# Patient Record
Sex: Female | Born: 1935 | Race: White | Hispanic: No
Health system: Southern US, Community
[De-identification: ages and names within clinical notes are randomized; demographics above are authoritative.]

---

## 1998-01-13 ENCOUNTER — Ambulatory Visit (HOSPITAL_COMMUNITY): Admission: RE | Admit: 1998-01-13 | Discharge: 1998-01-13 | Payer: Self-pay | Admitting: Obstetrics & Gynecology

## 2006-06-01 ENCOUNTER — Encounter: Admission: RE | Admit: 2006-06-01 | Discharge: 2006-06-01 | Payer: Self-pay | Admitting: Orthopedic Surgery

## 2006-06-02 ENCOUNTER — Ambulatory Visit (HOSPITAL_BASED_OUTPATIENT_CLINIC_OR_DEPARTMENT_OTHER): Admission: RE | Admit: 2006-06-02 | Discharge: 2006-06-02 | Payer: Self-pay | Admitting: Orthopedic Surgery

## 2008-11-13 IMAGING — CR DG CHEST 2V
2 series · 2 of 2 positions shown · non-contrast
Comparison: none

CLINICAL DATA: Preop respiratory exam for knee surgery for tear. 
 TWO VIEW CHEST:
 Two views of the chest show the lungs to be clear.  There is moderate cardiomegaly present.  No acute bony abnormality is seen.

[view not recorded (1 of 2)]
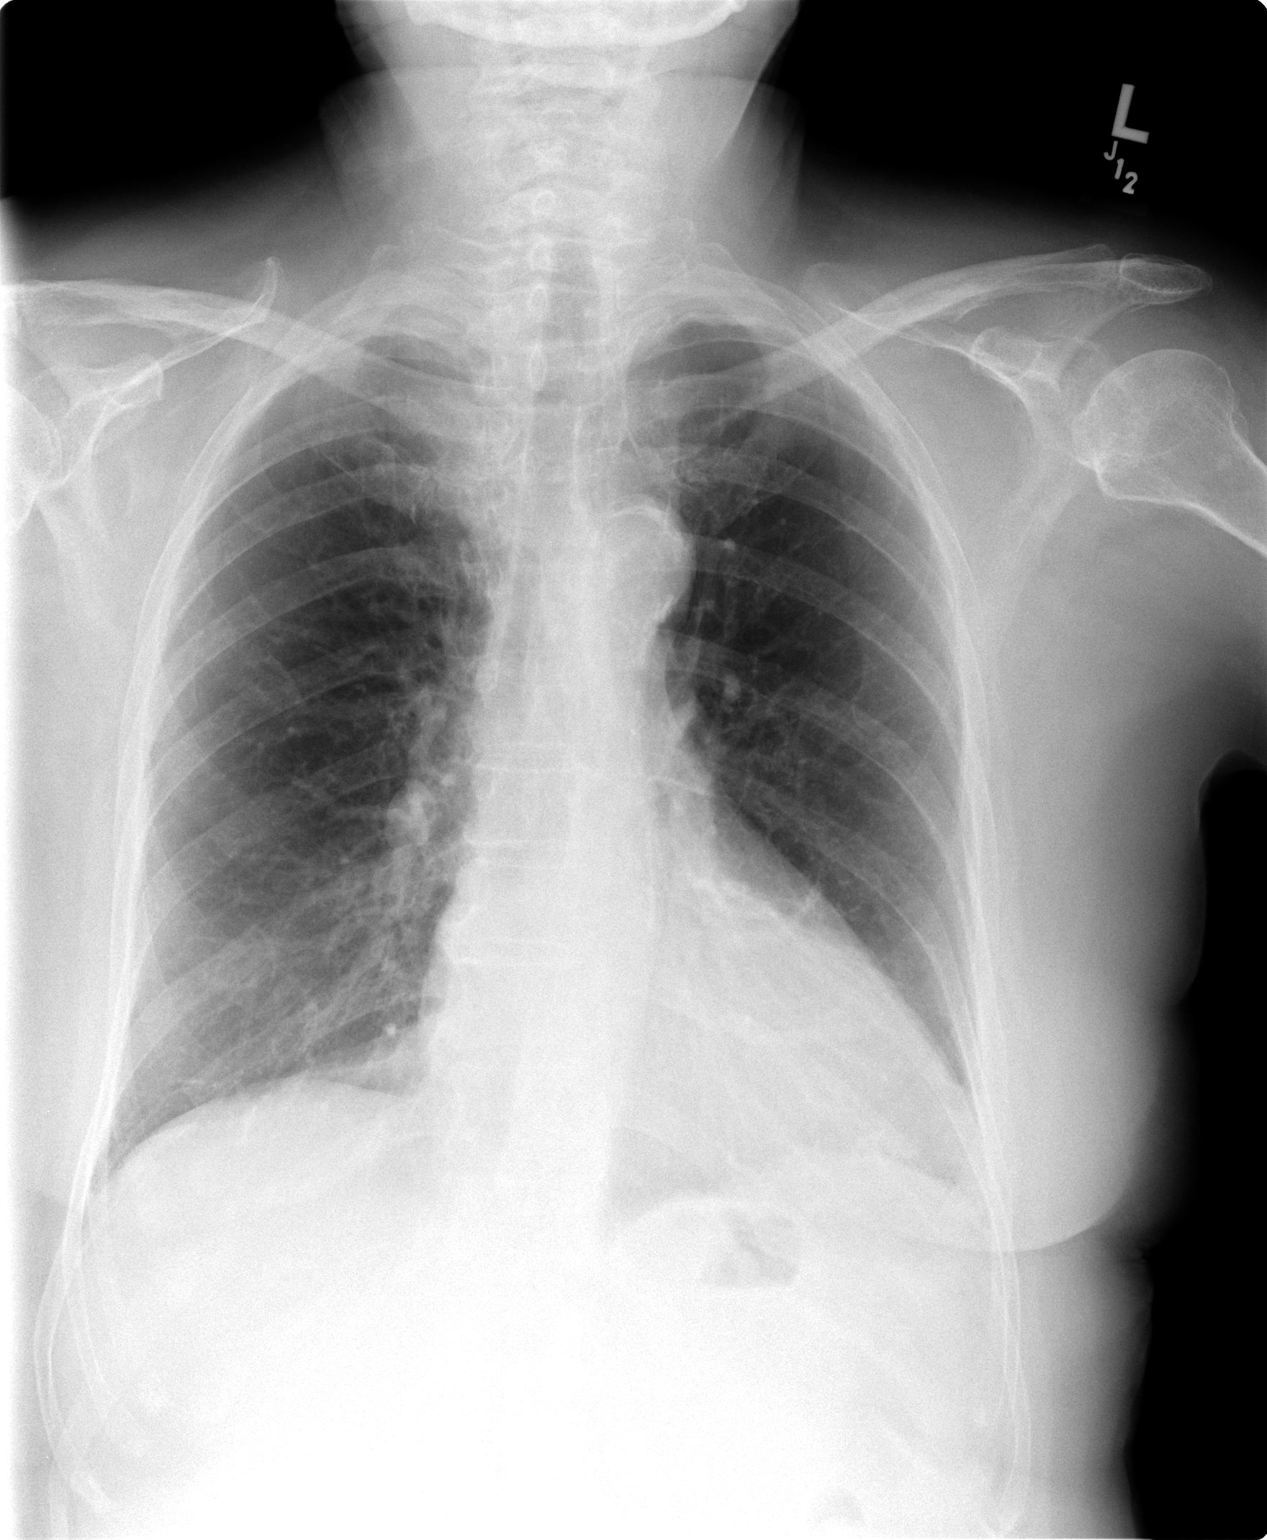

[view not recorded (2 of 2)]
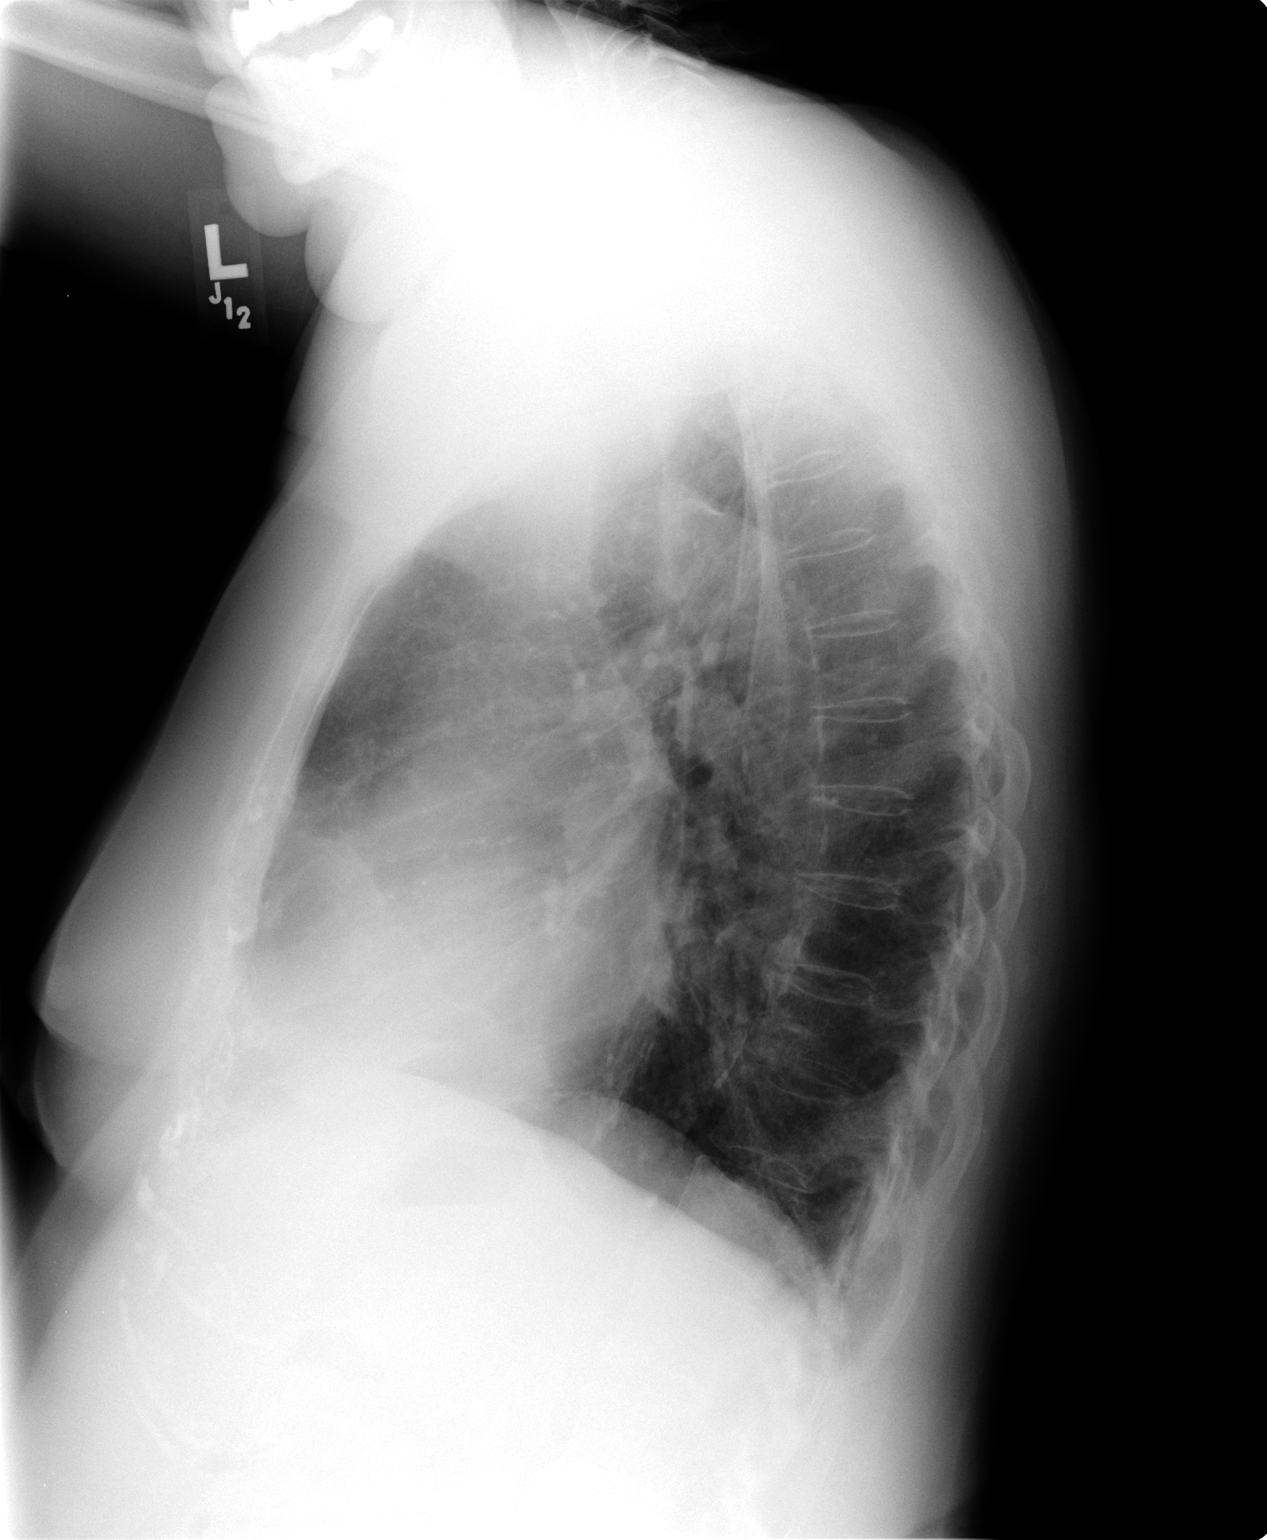

[2 of 2 positions shown; findings below may reference images not displayed]

IMPRESSION: Cardiomegaly.  No active lung disease.

## 2010-05-19 NOTE — Op Note (Signed)
Courtney Clarke, INCLAN               ACCOUNT NO.:  [id number]   MEDICAL RECORD NO.:  [id number]          PATIENT TYPE:  AMB   LOCATION:  [place]                          FACILITY:  [facility]   PHYSICIAN:  Rodney A. Mortenson, M.D.DATE OF BIRTH:  Aug 10, 1923   DATE OF PROCEDURE:  06/02/2006  DATE OF DISCHARGE:                               OPERATIVE REPORT   PREOPERATIVE DIAGNOSIS:  Torn medial meniscus, posterior horn left knee.   POSTOPERATIVE DIAGNOSIS:  Torn medial meniscus, posterior horn left  knee.   OPERATION:  Partial medial meniscectomy left knee.   SURGEON:  Geroge Baseman. Alphonzo Cruise, M.D.   ANESTHESIA:  MAC.   PATHOLOGY:  With the arthroscope in the knee, a very careful examination  of the knee was undertaken.  The patellofemoral joint was visualized  first.  There was some very mild chondromalacia this level.  Anterior  cruciate ligament was visualized.  This was normal.  The lateral  compartment there was normal articular cartilage over the lateral  femoral condyle, lateral tibial plateau and entire circumference of  lateral meniscus was normal.  The medial compartment there was normal  articular cartilage of the femoral condyle and tibial plateau but there  was fraying and tearing of posterior third medial meniscus.   PROCEDURE:  The patient placed on the table in supine position.  Pneumatic tourniquet about the left upper thigh.  Left leg was placed in  the leg holder.  The entire left lower extremity prepped with DuraPrep  and draped out in the usual manner.  Infusion cannula was placed  superior medial pouch and the knee distended saline.  Anteromedial,  anterolateral portals were made.  The arthroscope was introduced.  All  pathology was primarily in the medial compartment.  Through both medial  lateral portals a series of baskets were inserted and the torn posterior  horn was aggressively debrided.  This was followed intra-arterially  shaver.  All debris was removed.  The  remaining rim was then smoothed  and balanced, nice transition mid third of the medial meniscus.  Excellent decompression torn portion of the meniscus was achieved.  Knee  was then filled with Marcaine.  A large bulky pressure dressing applied  and the patient returned recovery room in excellent condition.  Technically this procedure extremely well.   FOLLOWUP:  1. To my office on Wednesday.  2. Up ad lib.  3. Change dressings on Saturday.  4. Percocet for pain.           ______________________________  Geroge Baseman. Alphonzo Cruise, M.D.    RAM/MEDQ  D:  06/02/2006  T:  06/02/2006  Job:  [id number]

## 2012-03-08 DIAGNOSIS — R0602 Shortness of breath: Secondary | ICD-10-CM

## 2014-01-09 DIAGNOSIS — I10 Essential (primary) hypertension: Secondary | ICD-10-CM | POA: Diagnosis not present

## 2014-01-09 DIAGNOSIS — E782 Mixed hyperlipidemia: Secondary | ICD-10-CM | POA: Diagnosis not present

## 2014-01-09 DIAGNOSIS — N183 Chronic kidney disease, stage 3 (moderate): Secondary | ICD-10-CM | POA: Diagnosis not present

## 2014-01-09 DIAGNOSIS — E1165 Type 2 diabetes mellitus with hyperglycemia: Secondary | ICD-10-CM | POA: Diagnosis not present

## 2014-01-16 DIAGNOSIS — Z0001 Encounter for general adult medical examination with abnormal findings: Secondary | ICD-10-CM | POA: Diagnosis not present

## 2014-01-16 DIAGNOSIS — E1142 Type 2 diabetes mellitus with diabetic polyneuropathy: Secondary | ICD-10-CM | POA: Diagnosis not present

## 2014-01-16 DIAGNOSIS — I5033 Acute on chronic diastolic (congestive) heart failure: Secondary | ICD-10-CM | POA: Diagnosis not present

## 2014-01-16 DIAGNOSIS — Z9189 Other specified personal risk factors, not elsewhere classified: Secondary | ICD-10-CM | POA: Diagnosis not present

## 2014-01-16 DIAGNOSIS — Z1389 Encounter for screening for other disorder: Secondary | ICD-10-CM | POA: Diagnosis not present

## 2014-01-31 DIAGNOSIS — X32XXXA Exposure to sunlight, initial encounter: Secondary | ICD-10-CM | POA: Diagnosis not present

## 2014-01-31 DIAGNOSIS — L219 Seborrheic dermatitis, unspecified: Secondary | ICD-10-CM | POA: Diagnosis not present

## 2014-01-31 DIAGNOSIS — L570 Actinic keratosis: Secondary | ICD-10-CM | POA: Diagnosis not present

## 2014-01-31 DIAGNOSIS — D225 Melanocytic nevi of trunk: Secondary | ICD-10-CM | POA: Diagnosis not present

## 2014-02-19 DIAGNOSIS — E1142 Type 2 diabetes mellitus with diabetic polyneuropathy: Secondary | ICD-10-CM | POA: Diagnosis not present

## 2014-04-08 DIAGNOSIS — M216X9 Other acquired deformities of unspecified foot: Secondary | ICD-10-CM | POA: Diagnosis not present

## 2014-04-08 DIAGNOSIS — L851 Acquired keratosis [keratoderma] palmaris et plantaris: Secondary | ICD-10-CM | POA: Diagnosis not present

## 2014-04-08 DIAGNOSIS — B351 Tinea unguium: Secondary | ICD-10-CM | POA: Diagnosis not present

## 2014-04-08 DIAGNOSIS — E1342 Other specified diabetes mellitus with diabetic polyneuropathy: Secondary | ICD-10-CM | POA: Diagnosis not present

## 2014-05-07 DIAGNOSIS — E782 Mixed hyperlipidemia: Secondary | ICD-10-CM | POA: Diagnosis not present

## 2014-05-07 DIAGNOSIS — I10 Essential (primary) hypertension: Secondary | ICD-10-CM | POA: Diagnosis not present

## 2014-05-07 DIAGNOSIS — N183 Chronic kidney disease, stage 3 (moderate): Secondary | ICD-10-CM | POA: Diagnosis not present

## 2014-05-07 DIAGNOSIS — G629 Polyneuropathy, unspecified: Secondary | ICD-10-CM | POA: Diagnosis not present

## 2014-05-07 DIAGNOSIS — E1122 Type 2 diabetes mellitus with diabetic chronic kidney disease: Secondary | ICD-10-CM | POA: Diagnosis not present

## 2014-05-14 DIAGNOSIS — E1142 Type 2 diabetes mellitus with diabetic polyneuropathy: Secondary | ICD-10-CM | POA: Diagnosis not present

## 2014-05-14 DIAGNOSIS — E1122 Type 2 diabetes mellitus with diabetic chronic kidney disease: Secondary | ICD-10-CM | POA: Diagnosis not present

## 2014-05-14 DIAGNOSIS — I10 Essential (primary) hypertension: Secondary | ICD-10-CM | POA: Diagnosis not present

## 2014-05-14 DIAGNOSIS — E782 Mixed hyperlipidemia: Secondary | ICD-10-CM | POA: Diagnosis not present

## 2014-05-14 DIAGNOSIS — G629 Polyneuropathy, unspecified: Secondary | ICD-10-CM | POA: Diagnosis not present

## 2014-07-15 DIAGNOSIS — B351 Tinea unguium: Secondary | ICD-10-CM | POA: Diagnosis not present

## 2014-07-15 DIAGNOSIS — E1342 Other specified diabetes mellitus with diabetic polyneuropathy: Secondary | ICD-10-CM | POA: Diagnosis not present

## 2014-07-15 DIAGNOSIS — L851 Acquired keratosis [keratoderma] palmaris et plantaris: Secondary | ICD-10-CM | POA: Diagnosis not present

## 2014-07-15 DIAGNOSIS — M216X9 Other acquired deformities of unspecified foot: Secondary | ICD-10-CM | POA: Diagnosis not present

## 2014-10-15 DIAGNOSIS — B351 Tinea unguium: Secondary | ICD-10-CM | POA: Diagnosis not present

## 2014-10-15 DIAGNOSIS — E1142 Type 2 diabetes mellitus with diabetic polyneuropathy: Secondary | ICD-10-CM | POA: Diagnosis not present

## 2014-10-15 DIAGNOSIS — L851 Acquired keratosis [keratoderma] palmaris et plantaris: Secondary | ICD-10-CM | POA: Diagnosis not present

## 2014-10-21 DIAGNOSIS — J209 Acute bronchitis, unspecified: Secondary | ICD-10-CM | POA: Diagnosis not present

## 2014-10-21 DIAGNOSIS — J019 Acute sinusitis, unspecified: Secondary | ICD-10-CM | POA: Diagnosis not present

## 2014-10-31 DIAGNOSIS — E1122 Type 2 diabetes mellitus with diabetic chronic kidney disease: Secondary | ICD-10-CM | POA: Diagnosis not present

## 2014-10-31 DIAGNOSIS — E782 Mixed hyperlipidemia: Secondary | ICD-10-CM | POA: Diagnosis not present

## 2014-10-31 DIAGNOSIS — N183 Chronic kidney disease, stage 3 (moderate): Secondary | ICD-10-CM | POA: Diagnosis not present

## 2014-10-31 DIAGNOSIS — I10 Essential (primary) hypertension: Secondary | ICD-10-CM | POA: Diagnosis not present

## 2014-11-05 DIAGNOSIS — E1142 Type 2 diabetes mellitus with diabetic polyneuropathy: Secondary | ICD-10-CM | POA: Diagnosis not present

## 2014-11-05 DIAGNOSIS — G629 Polyneuropathy, unspecified: Secondary | ICD-10-CM | POA: Diagnosis not present

## 2014-11-05 DIAGNOSIS — E782 Mixed hyperlipidemia: Secondary | ICD-10-CM | POA: Diagnosis not present

## 2014-11-05 DIAGNOSIS — I10 Essential (primary) hypertension: Secondary | ICD-10-CM | POA: Diagnosis not present

## 2014-11-05 DIAGNOSIS — E1122 Type 2 diabetes mellitus with diabetic chronic kidney disease: Secondary | ICD-10-CM | POA: Diagnosis not present

## 2014-12-31 DIAGNOSIS — B351 Tinea unguium: Secondary | ICD-10-CM | POA: Diagnosis not present

## 2014-12-31 DIAGNOSIS — L851 Acquired keratosis [keratoderma] palmaris et plantaris: Secondary | ICD-10-CM | POA: Diagnosis not present

## 2014-12-31 DIAGNOSIS — E1142 Type 2 diabetes mellitus with diabetic polyneuropathy: Secondary | ICD-10-CM | POA: Diagnosis not present

## 2015-01-09 DIAGNOSIS — Z1231 Encounter for screening mammogram for malignant neoplasm of breast: Secondary | ICD-10-CM | POA: Diagnosis not present

## 2015-01-28 DIAGNOSIS — E11319 Type 2 diabetes mellitus with unspecified diabetic retinopathy without macular edema: Secondary | ICD-10-CM | POA: Diagnosis not present

## 2015-03-03 DIAGNOSIS — I25111 Atherosclerotic heart disease of native coronary artery with angina pectoris with documented spasm: Secondary | ICD-10-CM | POA: Diagnosis not present

## 2015-03-03 DIAGNOSIS — E1122 Type 2 diabetes mellitus with diabetic chronic kidney disease: Secondary | ICD-10-CM | POA: Diagnosis not present

## 2015-03-03 DIAGNOSIS — I10 Essential (primary) hypertension: Secondary | ICD-10-CM | POA: Diagnosis not present

## 2015-03-03 DIAGNOSIS — E782 Mixed hyperlipidemia: Secondary | ICD-10-CM | POA: Diagnosis not present

## 2015-03-06 DIAGNOSIS — E782 Mixed hyperlipidemia: Secondary | ICD-10-CM | POA: Diagnosis not present

## 2015-03-06 DIAGNOSIS — I5032 Chronic diastolic (congestive) heart failure: Secondary | ICD-10-CM | POA: Diagnosis not present

## 2015-03-06 DIAGNOSIS — I10 Essential (primary) hypertension: Secondary | ICD-10-CM | POA: Diagnosis not present

## 2015-03-06 DIAGNOSIS — E1142 Type 2 diabetes mellitus with diabetic polyneuropathy: Secondary | ICD-10-CM | POA: Diagnosis not present

## 2015-03-06 DIAGNOSIS — E1122 Type 2 diabetes mellitus with diabetic chronic kidney disease: Secondary | ICD-10-CM | POA: Diagnosis not present

## 2015-03-11 DIAGNOSIS — B351 Tinea unguium: Secondary | ICD-10-CM | POA: Diagnosis not present

## 2015-03-11 DIAGNOSIS — L851 Acquired keratosis [keratoderma] palmaris et plantaris: Secondary | ICD-10-CM | POA: Diagnosis not present

## 2015-03-11 DIAGNOSIS — E1142 Type 2 diabetes mellitus with diabetic polyneuropathy: Secondary | ICD-10-CM | POA: Diagnosis not present

## 2015-04-28 DIAGNOSIS — L570 Actinic keratosis: Secondary | ICD-10-CM | POA: Diagnosis not present

## 2015-05-20 DIAGNOSIS — B351 Tinea unguium: Secondary | ICD-10-CM | POA: Diagnosis not present

## 2015-05-20 DIAGNOSIS — L851 Acquired keratosis [keratoderma] palmaris et plantaris: Secondary | ICD-10-CM | POA: Diagnosis not present

## 2015-05-20 DIAGNOSIS — E1142 Type 2 diabetes mellitus with diabetic polyneuropathy: Secondary | ICD-10-CM | POA: Diagnosis not present

## 2015-07-04 DIAGNOSIS — E782 Mixed hyperlipidemia: Secondary | ICD-10-CM | POA: Diagnosis not present

## 2015-07-04 DIAGNOSIS — I10 Essential (primary) hypertension: Secondary | ICD-10-CM | POA: Diagnosis not present

## 2015-07-04 DIAGNOSIS — N183 Chronic kidney disease, stage 3 (moderate): Secondary | ICD-10-CM | POA: Diagnosis not present

## 2015-07-04 DIAGNOSIS — E1165 Type 2 diabetes mellitus with hyperglycemia: Secondary | ICD-10-CM | POA: Diagnosis not present

## 2015-07-07 DIAGNOSIS — I10 Essential (primary) hypertension: Secondary | ICD-10-CM | POA: Diagnosis not present

## 2015-07-07 DIAGNOSIS — Z0001 Encounter for general adult medical examination with abnormal findings: Secondary | ICD-10-CM | POA: Diagnosis not present

## 2015-07-07 DIAGNOSIS — E1122 Type 2 diabetes mellitus with diabetic chronic kidney disease: Secondary | ICD-10-CM | POA: Diagnosis not present

## 2015-07-07 DIAGNOSIS — Z1212 Encounter for screening for malignant neoplasm of rectum: Secondary | ICD-10-CM | POA: Diagnosis not present

## 2015-07-07 DIAGNOSIS — E1142 Type 2 diabetes mellitus with diabetic polyneuropathy: Secondary | ICD-10-CM | POA: Diagnosis not present

## 2015-07-07 DIAGNOSIS — E782 Mixed hyperlipidemia: Secondary | ICD-10-CM | POA: Diagnosis not present

## 2015-07-19 DIAGNOSIS — C44329 Squamous cell carcinoma of skin of other parts of face: Secondary | ICD-10-CM | POA: Diagnosis not present

## 2015-07-19 DIAGNOSIS — C4442 Squamous cell carcinoma of skin of scalp and neck: Secondary | ICD-10-CM | POA: Diagnosis not present

## 2015-07-28 DIAGNOSIS — S41112A Laceration without foreign body of left upper arm, initial encounter: Secondary | ICD-10-CM | POA: Diagnosis not present

## 2015-07-28 DIAGNOSIS — Z6824 Body mass index (BMI) 24.0-24.9, adult: Secondary | ICD-10-CM | POA: Diagnosis not present

## 2015-07-29 DIAGNOSIS — L851 Acquired keratosis [keratoderma] palmaris et plantaris: Secondary | ICD-10-CM | POA: Diagnosis not present

## 2015-07-29 DIAGNOSIS — B351 Tinea unguium: Secondary | ICD-10-CM | POA: Diagnosis not present

## 2015-07-29 DIAGNOSIS — E1142 Type 2 diabetes mellitus with diabetic polyneuropathy: Secondary | ICD-10-CM | POA: Diagnosis not present

## 2015-10-14 DIAGNOSIS — E1142 Type 2 diabetes mellitus with diabetic polyneuropathy: Secondary | ICD-10-CM | POA: Diagnosis not present

## 2015-10-14 DIAGNOSIS — B351 Tinea unguium: Secondary | ICD-10-CM | POA: Diagnosis not present

## 2015-10-14 DIAGNOSIS — L851 Acquired keratosis [keratoderma] palmaris et plantaris: Secondary | ICD-10-CM | POA: Diagnosis not present

## 2015-11-04 DIAGNOSIS — E782 Mixed hyperlipidemia: Secondary | ICD-10-CM | POA: Diagnosis not present

## 2015-11-04 DIAGNOSIS — E1122 Type 2 diabetes mellitus with diabetic chronic kidney disease: Secondary | ICD-10-CM | POA: Diagnosis not present

## 2015-11-04 DIAGNOSIS — I10 Essential (primary) hypertension: Secondary | ICD-10-CM | POA: Diagnosis not present

## 2015-11-04 DIAGNOSIS — I25111 Atherosclerotic heart disease of native coronary artery with angina pectoris with documented spasm: Secondary | ICD-10-CM | POA: Diagnosis not present

## 2015-11-04 DIAGNOSIS — E1142 Type 2 diabetes mellitus with diabetic polyneuropathy: Secondary | ICD-10-CM | POA: Diagnosis not present

## 2015-11-07 DIAGNOSIS — I5032 Chronic diastolic (congestive) heart failure: Secondary | ICD-10-CM | POA: Diagnosis not present

## 2015-11-07 DIAGNOSIS — E782 Mixed hyperlipidemia: Secondary | ICD-10-CM | POA: Diagnosis not present

## 2015-11-07 DIAGNOSIS — I10 Essential (primary) hypertension: Secondary | ICD-10-CM | POA: Diagnosis not present

## 2015-11-07 DIAGNOSIS — E1122 Type 2 diabetes mellitus with diabetic chronic kidney disease: Secondary | ICD-10-CM | POA: Diagnosis not present

## 2015-11-07 DIAGNOSIS — E1142 Type 2 diabetes mellitus with diabetic polyneuropathy: Secondary | ICD-10-CM | POA: Diagnosis not present

## 2016-01-06 DIAGNOSIS — B351 Tinea unguium: Secondary | ICD-10-CM | POA: Diagnosis not present

## 2016-01-06 DIAGNOSIS — E1142 Type 2 diabetes mellitus with diabetic polyneuropathy: Secondary | ICD-10-CM | POA: Diagnosis not present

## 2016-01-06 DIAGNOSIS — L851 Acquired keratosis [keratoderma] palmaris et plantaris: Secondary | ICD-10-CM | POA: Diagnosis not present

## 2016-01-19 DIAGNOSIS — Z1231 Encounter for screening mammogram for malignant neoplasm of breast: Secondary | ICD-10-CM | POA: Diagnosis not present

## 2016-03-10 DIAGNOSIS — N183 Chronic kidney disease, stage 3 (moderate): Secondary | ICD-10-CM | POA: Diagnosis not present

## 2016-03-10 DIAGNOSIS — E1122 Type 2 diabetes mellitus with diabetic chronic kidney disease: Secondary | ICD-10-CM | POA: Diagnosis not present

## 2016-03-10 DIAGNOSIS — E782 Mixed hyperlipidemia: Secondary | ICD-10-CM | POA: Diagnosis not present

## 2016-03-10 DIAGNOSIS — I2510 Atherosclerotic heart disease of native coronary artery without angina pectoris: Secondary | ICD-10-CM | POA: Diagnosis not present

## 2016-03-15 DIAGNOSIS — E1142 Type 2 diabetes mellitus with diabetic polyneuropathy: Secondary | ICD-10-CM | POA: Diagnosis not present

## 2016-03-15 DIAGNOSIS — E1122 Type 2 diabetes mellitus with diabetic chronic kidney disease: Secondary | ICD-10-CM | POA: Diagnosis not present

## 2016-03-15 DIAGNOSIS — I10 Essential (primary) hypertension: Secondary | ICD-10-CM | POA: Diagnosis not present

## 2016-03-15 DIAGNOSIS — E782 Mixed hyperlipidemia: Secondary | ICD-10-CM | POA: Diagnosis not present

## 2016-03-15 DIAGNOSIS — I5032 Chronic diastolic (congestive) heart failure: Secondary | ICD-10-CM | POA: Diagnosis not present

## 2016-03-26 DIAGNOSIS — L851 Acquired keratosis [keratoderma] palmaris et plantaris: Secondary | ICD-10-CM | POA: Diagnosis not present

## 2016-03-26 DIAGNOSIS — E1142 Type 2 diabetes mellitus with diabetic polyneuropathy: Secondary | ICD-10-CM | POA: Diagnosis not present

## 2016-03-26 DIAGNOSIS — B351 Tinea unguium: Secondary | ICD-10-CM | POA: Diagnosis not present

## 2016-05-07 DIAGNOSIS — L570 Actinic keratosis: Secondary | ICD-10-CM | POA: Diagnosis not present

## 2016-06-11 DIAGNOSIS — E1142 Type 2 diabetes mellitus with diabetic polyneuropathy: Secondary | ICD-10-CM | POA: Diagnosis not present

## 2016-06-11 DIAGNOSIS — L851 Acquired keratosis [keratoderma] palmaris et plantaris: Secondary | ICD-10-CM | POA: Diagnosis not present

## 2016-07-09 DIAGNOSIS — E1165 Type 2 diabetes mellitus with hyperglycemia: Secondary | ICD-10-CM | POA: Diagnosis not present

## 2016-07-09 DIAGNOSIS — E1142 Type 2 diabetes mellitus with diabetic polyneuropathy: Secondary | ICD-10-CM | POA: Diagnosis not present

## 2016-07-09 DIAGNOSIS — E782 Mixed hyperlipidemia: Secondary | ICD-10-CM | POA: Diagnosis not present

## 2016-07-09 DIAGNOSIS — I10 Essential (primary) hypertension: Secondary | ICD-10-CM | POA: Diagnosis not present

## 2016-07-09 DIAGNOSIS — E1122 Type 2 diabetes mellitus with diabetic chronic kidney disease: Secondary | ICD-10-CM | POA: Diagnosis not present

## 2016-07-12 DIAGNOSIS — Z1212 Encounter for screening for malignant neoplasm of rectum: Secondary | ICD-10-CM | POA: Diagnosis not present

## 2016-07-12 DIAGNOSIS — E782 Mixed hyperlipidemia: Secondary | ICD-10-CM | POA: Diagnosis not present

## 2016-07-12 DIAGNOSIS — Z0001 Encounter for general adult medical examination with abnormal findings: Secondary | ICD-10-CM | POA: Diagnosis not present

## 2016-07-12 DIAGNOSIS — E1122 Type 2 diabetes mellitus with diabetic chronic kidney disease: Secondary | ICD-10-CM | POA: Diagnosis not present

## 2016-07-12 DIAGNOSIS — I5032 Chronic diastolic (congestive) heart failure: Secondary | ICD-10-CM | POA: Diagnosis not present

## 2016-07-12 DIAGNOSIS — I10 Essential (primary) hypertension: Secondary | ICD-10-CM | POA: Diagnosis not present

## 2016-07-24 DIAGNOSIS — S0990XA Unspecified injury of head, initial encounter: Secondary | ICD-10-CM | POA: Diagnosis not present

## 2016-07-24 DIAGNOSIS — E785 Hyperlipidemia, unspecified: Secondary | ICD-10-CM | POA: Diagnosis not present

## 2016-07-24 DIAGNOSIS — Z8249 Family history of ischemic heart disease and other diseases of the circulatory system: Secondary | ICD-10-CM | POA: Diagnosis not present

## 2016-07-24 DIAGNOSIS — R918 Other nonspecific abnormal finding of lung field: Secondary | ICD-10-CM | POA: Diagnosis not present

## 2016-07-24 DIAGNOSIS — R42 Dizziness and giddiness: Secondary | ICD-10-CM | POA: Diagnosis not present

## 2016-07-24 DIAGNOSIS — R51 Headache: Secondary | ICD-10-CM | POA: Diagnosis not present

## 2016-07-24 DIAGNOSIS — N183 Chronic kidney disease, stage 3 (moderate): Secondary | ICD-10-CM | POA: Diagnosis not present

## 2016-07-24 DIAGNOSIS — Z79899 Other long term (current) drug therapy: Secondary | ICD-10-CM | POA: Diagnosis not present

## 2016-07-24 DIAGNOSIS — Z7984 Long term (current) use of oral hypoglycemic drugs: Secondary | ICD-10-CM | POA: Diagnosis not present

## 2016-07-24 DIAGNOSIS — E1122 Type 2 diabetes mellitus with diabetic chronic kidney disease: Secondary | ICD-10-CM | POA: Diagnosis not present

## 2016-07-24 DIAGNOSIS — W01198A Fall on same level from slipping, tripping and stumbling with subsequent striking against other object, initial encounter: Secondary | ICD-10-CM | POA: Diagnosis not present

## 2016-07-24 DIAGNOSIS — R55 Syncope and collapse: Secondary | ICD-10-CM | POA: Diagnosis not present

## 2016-07-24 DIAGNOSIS — D631 Anemia in chronic kidney disease: Secondary | ICD-10-CM | POA: Diagnosis not present

## 2016-07-24 DIAGNOSIS — S0101XA Laceration without foreign body of scalp, initial encounter: Secondary | ICD-10-CM | POA: Diagnosis not present

## 2016-07-24 DIAGNOSIS — M1990 Unspecified osteoarthritis, unspecified site: Secondary | ICD-10-CM | POA: Diagnosis not present

## 2016-07-24 DIAGNOSIS — R112 Nausea with vomiting, unspecified: Secondary | ICD-10-CM | POA: Diagnosis not present

## 2016-07-24 DIAGNOSIS — Z888 Allergy status to other drugs, medicaments and biological substances status: Secondary | ICD-10-CM | POA: Diagnosis not present

## 2016-07-24 DIAGNOSIS — I2510 Atherosclerotic heart disease of native coronary artery without angina pectoris: Secondary | ICD-10-CM | POA: Diagnosis not present

## 2016-07-24 DIAGNOSIS — Z9049 Acquired absence of other specified parts of digestive tract: Secondary | ICD-10-CM | POA: Diagnosis not present

## 2016-07-24 DIAGNOSIS — Z836 Family history of other diseases of the respiratory system: Secondary | ICD-10-CM | POA: Diagnosis not present

## 2016-07-24 DIAGNOSIS — I129 Hypertensive chronic kidney disease with stage 1 through stage 4 chronic kidney disease, or unspecified chronic kidney disease: Secondary | ICD-10-CM | POA: Diagnosis not present

## 2016-07-24 DIAGNOSIS — Z7982 Long term (current) use of aspirin: Secondary | ICD-10-CM | POA: Diagnosis not present

## 2016-07-24 DIAGNOSIS — S199XXA Unspecified injury of neck, initial encounter: Secondary | ICD-10-CM | POA: Diagnosis not present

## 2016-08-19 DIAGNOSIS — E11319 Type 2 diabetes mellitus with unspecified diabetic retinopathy without macular edema: Secondary | ICD-10-CM | POA: Diagnosis not present

## 2016-08-20 DIAGNOSIS — L851 Acquired keratosis [keratoderma] palmaris et plantaris: Secondary | ICD-10-CM | POA: Diagnosis not present

## 2016-08-20 DIAGNOSIS — E1142 Type 2 diabetes mellitus with diabetic polyneuropathy: Secondary | ICD-10-CM | POA: Diagnosis not present

## 2016-08-20 DIAGNOSIS — B351 Tinea unguium: Secondary | ICD-10-CM | POA: Diagnosis not present

## 2016-08-24 DIAGNOSIS — Z6822 Body mass index (BMI) 22.0-22.9, adult: Secondary | ICD-10-CM | POA: Diagnosis not present

## 2016-08-24 DIAGNOSIS — R55 Syncope and collapse: Secondary | ICD-10-CM | POA: Diagnosis not present

## 2016-09-28 ENCOUNTER — Other Ambulatory Visit: Payer: Self-pay

## 2016-09-28 NOTE — Patient Outreach (Signed)
[facility]) Care Management  09/28/2016  Courtney Clarke [birth year] [id number]   Medication Adherence call to Courtney Clarke the reason for this call is because Courtney Clarke is showing past due under [facility] Ins.on losartan 100 mg spoke to patient she said she was taken off this medication and now is on amlodipine  5 mg .   [place] Management Direct Dial [phone]  Fax [phone] [email or web address]

## 2016-11-11 DIAGNOSIS — E1122 Type 2 diabetes mellitus with diabetic chronic kidney disease: Secondary | ICD-10-CM | POA: Diagnosis not present

## 2016-11-11 DIAGNOSIS — E1142 Type 2 diabetes mellitus with diabetic polyneuropathy: Secondary | ICD-10-CM | POA: Diagnosis not present

## 2016-11-11 DIAGNOSIS — E782 Mixed hyperlipidemia: Secondary | ICD-10-CM | POA: Diagnosis not present

## 2016-11-11 DIAGNOSIS — I10 Essential (primary) hypertension: Secondary | ICD-10-CM | POA: Diagnosis not present

## 2016-11-11 DIAGNOSIS — E1165 Type 2 diabetes mellitus with hyperglycemia: Secondary | ICD-10-CM | POA: Diagnosis not present

## 2016-11-15 DIAGNOSIS — I5032 Chronic diastolic (congestive) heart failure: Secondary | ICD-10-CM | POA: Diagnosis not present

## 2016-11-15 DIAGNOSIS — I10 Essential (primary) hypertension: Secondary | ICD-10-CM | POA: Diagnosis not present

## 2016-11-15 DIAGNOSIS — E1122 Type 2 diabetes mellitus with diabetic chronic kidney disease: Secondary | ICD-10-CM | POA: Diagnosis not present

## 2016-11-15 DIAGNOSIS — E782 Mixed hyperlipidemia: Secondary | ICD-10-CM | POA: Diagnosis not present

## 2016-11-15 DIAGNOSIS — E1142 Type 2 diabetes mellitus with diabetic polyneuropathy: Secondary | ICD-10-CM | POA: Diagnosis not present

## 2016-12-07 DIAGNOSIS — Z6822 Body mass index (BMI) 22.0-22.9, adult: Secondary | ICD-10-CM | POA: Diagnosis not present

## 2016-12-07 DIAGNOSIS — J209 Acute bronchitis, unspecified: Secondary | ICD-10-CM | POA: Diagnosis not present

## 2016-12-07 DIAGNOSIS — R05 Cough: Secondary | ICD-10-CM | POA: Diagnosis not present

## 2016-12-07 DIAGNOSIS — I10 Essential (primary) hypertension: Secondary | ICD-10-CM | POA: Diagnosis not present

## 2017-01-20 DIAGNOSIS — Z1231 Encounter for screening mammogram for malignant neoplasm of breast: Secondary | ICD-10-CM | POA: Diagnosis not present

## 2017-03-11 DIAGNOSIS — E782 Mixed hyperlipidemia: Secondary | ICD-10-CM | POA: Diagnosis not present

## 2017-03-11 DIAGNOSIS — E1122 Type 2 diabetes mellitus with diabetic chronic kidney disease: Secondary | ICD-10-CM | POA: Diagnosis not present

## 2017-03-11 DIAGNOSIS — I25111 Atherosclerotic heart disease of native coronary artery with angina pectoris with documented spasm: Secondary | ICD-10-CM | POA: Diagnosis not present

## 2017-03-11 DIAGNOSIS — I10 Essential (primary) hypertension: Secondary | ICD-10-CM | POA: Diagnosis not present

## 2017-03-11 DIAGNOSIS — I5032 Chronic diastolic (congestive) heart failure: Secondary | ICD-10-CM | POA: Diagnosis not present

## 2017-03-11 DIAGNOSIS — N183 Chronic kidney disease, stage 3 (moderate): Secondary | ICD-10-CM | POA: Diagnosis not present

## 2017-03-15 DIAGNOSIS — E1142 Type 2 diabetes mellitus with diabetic polyneuropathy: Secondary | ICD-10-CM | POA: Diagnosis not present

## 2017-03-15 DIAGNOSIS — E782 Mixed hyperlipidemia: Secondary | ICD-10-CM | POA: Diagnosis not present

## 2017-03-15 DIAGNOSIS — E1122 Type 2 diabetes mellitus with diabetic chronic kidney disease: Secondary | ICD-10-CM | POA: Diagnosis not present

## 2017-03-15 DIAGNOSIS — I5032 Chronic diastolic (congestive) heart failure: Secondary | ICD-10-CM | POA: Diagnosis not present

## 2017-03-15 DIAGNOSIS — I10 Essential (primary) hypertension: Secondary | ICD-10-CM | POA: Diagnosis not present

## 2017-07-12 DIAGNOSIS — E1122 Type 2 diabetes mellitus with diabetic chronic kidney disease: Secondary | ICD-10-CM | POA: Diagnosis not present

## 2017-07-12 DIAGNOSIS — E1165 Type 2 diabetes mellitus with hyperglycemia: Secondary | ICD-10-CM | POA: Diagnosis not present

## 2017-07-12 DIAGNOSIS — Z9189 Other specified personal risk factors, not elsewhere classified: Secondary | ICD-10-CM | POA: Diagnosis not present

## 2017-07-12 DIAGNOSIS — E782 Mixed hyperlipidemia: Secondary | ICD-10-CM | POA: Diagnosis not present

## 2017-07-12 DIAGNOSIS — I10 Essential (primary) hypertension: Secondary | ICD-10-CM | POA: Diagnosis not present

## 2017-07-12 DIAGNOSIS — E1142 Type 2 diabetes mellitus with diabetic polyneuropathy: Secondary | ICD-10-CM | POA: Diagnosis not present

## 2017-07-15 DIAGNOSIS — E1142 Type 2 diabetes mellitus with diabetic polyneuropathy: Secondary | ICD-10-CM | POA: Diagnosis not present

## 2017-07-15 DIAGNOSIS — E782 Mixed hyperlipidemia: Secondary | ICD-10-CM | POA: Diagnosis not present

## 2017-07-15 DIAGNOSIS — Z0001 Encounter for general adult medical examination with abnormal findings: Secondary | ICD-10-CM | POA: Diagnosis not present

## 2017-07-15 DIAGNOSIS — E1122 Type 2 diabetes mellitus with diabetic chronic kidney disease: Secondary | ICD-10-CM | POA: Diagnosis not present

## 2017-07-15 DIAGNOSIS — I10 Essential (primary) hypertension: Secondary | ICD-10-CM | POA: Diagnosis not present

## 2017-07-16 DIAGNOSIS — D0439 Carcinoma in situ of skin of other parts of face: Secondary | ICD-10-CM | POA: Diagnosis not present

## 2017-07-16 DIAGNOSIS — C44329 Squamous cell carcinoma of skin of other parts of face: Secondary | ICD-10-CM | POA: Diagnosis not present

## 2017-07-28 DIAGNOSIS — C44329 Squamous cell carcinoma of skin of other parts of face: Secondary | ICD-10-CM | POA: Diagnosis not present

## 2017-08-11 DIAGNOSIS — Z85828 Personal history of other malignant neoplasm of skin: Secondary | ICD-10-CM | POA: Diagnosis not present

## 2017-08-11 DIAGNOSIS — C4442 Squamous cell carcinoma of skin of scalp and neck: Secondary | ICD-10-CM | POA: Diagnosis not present

## 2017-08-11 DIAGNOSIS — L570 Actinic keratosis: Secondary | ICD-10-CM | POA: Diagnosis not present

## 2017-08-11 DIAGNOSIS — D044 Carcinoma in situ of skin of scalp and neck: Secondary | ICD-10-CM | POA: Diagnosis not present

## 2017-08-11 DIAGNOSIS — D0439 Carcinoma in situ of skin of other parts of face: Secondary | ICD-10-CM | POA: Diagnosis not present

## 2017-08-11 DIAGNOSIS — D485 Neoplasm of uncertain behavior of skin: Secondary | ICD-10-CM | POA: Diagnosis not present

## 2017-09-08 DIAGNOSIS — D485 Neoplasm of uncertain behavior of skin: Secondary | ICD-10-CM | POA: Diagnosis not present

## 2017-09-08 DIAGNOSIS — C4442 Squamous cell carcinoma of skin of scalp and neck: Secondary | ICD-10-CM | POA: Diagnosis not present

## 2017-09-08 DIAGNOSIS — C44329 Squamous cell carcinoma of skin of other parts of face: Secondary | ICD-10-CM | POA: Diagnosis not present

## 2017-09-08 DIAGNOSIS — L570 Actinic keratosis: Secondary | ICD-10-CM | POA: Diagnosis not present

## 2017-09-22 DIAGNOSIS — C4442 Squamous cell carcinoma of skin of scalp and neck: Secondary | ICD-10-CM | POA: Diagnosis not present

## 2017-10-03 DIAGNOSIS — E782 Mixed hyperlipidemia: Secondary | ICD-10-CM | POA: Diagnosis not present

## 2017-10-03 DIAGNOSIS — I10 Essential (primary) hypertension: Secondary | ICD-10-CM | POA: Diagnosis not present

## 2017-10-03 DIAGNOSIS — E1122 Type 2 diabetes mellitus with diabetic chronic kidney disease: Secondary | ICD-10-CM | POA: Diagnosis not present

## 2017-11-02 DIAGNOSIS — I10 Essential (primary) hypertension: Secondary | ICD-10-CM | POA: Diagnosis not present

## 2017-11-02 DIAGNOSIS — E1122 Type 2 diabetes mellitus with diabetic chronic kidney disease: Secondary | ICD-10-CM | POA: Diagnosis not present

## 2017-11-02 DIAGNOSIS — I5030 Unspecified diastolic (congestive) heart failure: Secondary | ICD-10-CM | POA: Diagnosis not present

## 2017-11-02 DIAGNOSIS — E782 Mixed hyperlipidemia: Secondary | ICD-10-CM | POA: Diagnosis not present

## 2017-11-28 DIAGNOSIS — E1122 Type 2 diabetes mellitus with diabetic chronic kidney disease: Secondary | ICD-10-CM | POA: Diagnosis not present

## 2017-11-28 DIAGNOSIS — Z7689 Persons encountering health services in other specified circumstances: Secondary | ICD-10-CM | POA: Diagnosis not present

## 2017-11-28 DIAGNOSIS — I2510 Atherosclerotic heart disease of native coronary artery without angina pectoris: Secondary | ICD-10-CM | POA: Diagnosis not present

## 2017-11-28 DIAGNOSIS — E1142 Type 2 diabetes mellitus with diabetic polyneuropathy: Secondary | ICD-10-CM | POA: Diagnosis not present

## 2017-11-28 DIAGNOSIS — E782 Mixed hyperlipidemia: Secondary | ICD-10-CM | POA: Diagnosis not present

## 2017-11-28 DIAGNOSIS — I5032 Chronic diastolic (congestive) heart failure: Secondary | ICD-10-CM | POA: Diagnosis not present

## 2017-11-28 DIAGNOSIS — D529 Folate deficiency anemia, unspecified: Secondary | ICD-10-CM | POA: Diagnosis not present

## 2017-11-28 DIAGNOSIS — M1712 Unilateral primary osteoarthritis, left knee: Secondary | ICD-10-CM | POA: Diagnosis not present

## 2017-11-28 DIAGNOSIS — I10 Essential (primary) hypertension: Secondary | ICD-10-CM | POA: Diagnosis not present

## 2017-11-28 DIAGNOSIS — D519 Vitamin B12 deficiency anemia, unspecified: Secondary | ICD-10-CM | POA: Diagnosis not present

## 2017-11-28 DIAGNOSIS — D509 Iron deficiency anemia, unspecified: Secondary | ICD-10-CM | POA: Diagnosis not present

## 2017-12-05 DIAGNOSIS — D519 Vitamin B12 deficiency anemia, unspecified: Secondary | ICD-10-CM | POA: Diagnosis not present

## 2017-12-05 DIAGNOSIS — I10 Essential (primary) hypertension: Secondary | ICD-10-CM | POA: Diagnosis not present

## 2017-12-12 DIAGNOSIS — D519 Vitamin B12 deficiency anemia, unspecified: Secondary | ICD-10-CM | POA: Diagnosis not present

## 2017-12-19 DIAGNOSIS — D519 Vitamin B12 deficiency anemia, unspecified: Secondary | ICD-10-CM | POA: Diagnosis not present

## 2017-12-26 DIAGNOSIS — D519 Vitamin B12 deficiency anemia, unspecified: Secondary | ICD-10-CM | POA: Diagnosis not present

## 2018-01-02 DIAGNOSIS — I10 Essential (primary) hypertension: Secondary | ICD-10-CM | POA: Diagnosis not present

## 2018-01-02 DIAGNOSIS — E782 Mixed hyperlipidemia: Secondary | ICD-10-CM | POA: Diagnosis not present

## 2018-01-02 DIAGNOSIS — E1122 Type 2 diabetes mellitus with diabetic chronic kidney disease: Secondary | ICD-10-CM | POA: Diagnosis not present

## 2018-01-03 DIAGNOSIS — L570 Actinic keratosis: Secondary | ICD-10-CM | POA: Diagnosis not present

## 2018-01-26 DIAGNOSIS — D519 Vitamin B12 deficiency anemia, unspecified: Secondary | ICD-10-CM | POA: Diagnosis not present

## 2018-01-31 DIAGNOSIS — Z1231 Encounter for screening mammogram for malignant neoplasm of breast: Secondary | ICD-10-CM | POA: Diagnosis not present

## 2018-01-31 DIAGNOSIS — E11319 Type 2 diabetes mellitus with unspecified diabetic retinopathy without macular edema: Secondary | ICD-10-CM | POA: Diagnosis not present

## 2018-02-28 DIAGNOSIS — E538 Deficiency of other specified B group vitamins: Secondary | ICD-10-CM | POA: Diagnosis not present

## 2018-03-16 DIAGNOSIS — E1165 Type 2 diabetes mellitus with hyperglycemia: Secondary | ICD-10-CM | POA: Diagnosis not present

## 2018-03-16 DIAGNOSIS — N183 Chronic kidney disease, stage 3 (moderate): Secondary | ICD-10-CM | POA: Diagnosis not present

## 2018-03-16 DIAGNOSIS — E1142 Type 2 diabetes mellitus with diabetic polyneuropathy: Secondary | ICD-10-CM | POA: Diagnosis not present

## 2018-03-16 DIAGNOSIS — E1122 Type 2 diabetes mellitus with diabetic chronic kidney disease: Secondary | ICD-10-CM | POA: Diagnosis not present

## 2018-03-16 DIAGNOSIS — C44329 Squamous cell carcinoma of skin of other parts of face: Secondary | ICD-10-CM | POA: Diagnosis not present

## 2018-03-20 DIAGNOSIS — I10 Essential (primary) hypertension: Secondary | ICD-10-CM | POA: Diagnosis not present

## 2018-03-20 DIAGNOSIS — I5032 Chronic diastolic (congestive) heart failure: Secondary | ICD-10-CM | POA: Diagnosis not present

## 2018-03-20 DIAGNOSIS — E782 Mixed hyperlipidemia: Secondary | ICD-10-CM | POA: Diagnosis not present

## 2018-03-20 DIAGNOSIS — E1142 Type 2 diabetes mellitus with diabetic polyneuropathy: Secondary | ICD-10-CM | POA: Diagnosis not present

## 2018-03-20 DIAGNOSIS — E1122 Type 2 diabetes mellitus with diabetic chronic kidney disease: Secondary | ICD-10-CM | POA: Diagnosis not present

## 2018-03-21 DIAGNOSIS — D485 Neoplasm of uncertain behavior of skin: Secondary | ICD-10-CM | POA: Diagnosis not present

## 2018-03-21 DIAGNOSIS — L570 Actinic keratosis: Secondary | ICD-10-CM | POA: Diagnosis not present

## 2018-03-21 DIAGNOSIS — C44319 Basal cell carcinoma of skin of other parts of face: Secondary | ICD-10-CM | POA: Diagnosis not present

## 2018-03-23 DIAGNOSIS — C44319 Basal cell carcinoma of skin of other parts of face: Secondary | ICD-10-CM | POA: Diagnosis not present

## 2018-03-31 DIAGNOSIS — E538 Deficiency of other specified B group vitamins: Secondary | ICD-10-CM | POA: Diagnosis not present

## 2018-05-17 DIAGNOSIS — D519 Vitamin B12 deficiency anemia, unspecified: Secondary | ICD-10-CM | POA: Diagnosis not present

## 2018-05-18 DIAGNOSIS — I5032 Chronic diastolic (congestive) heart failure: Secondary | ICD-10-CM | POA: Diagnosis not present

## 2018-05-18 DIAGNOSIS — I4891 Unspecified atrial fibrillation: Secondary | ICD-10-CM | POA: Diagnosis not present

## 2018-05-18 DIAGNOSIS — Z6822 Body mass index (BMI) 22.0-22.9, adult: Secondary | ICD-10-CM | POA: Diagnosis not present

## 2018-05-18 DIAGNOSIS — R0602 Shortness of breath: Secondary | ICD-10-CM | POA: Diagnosis not present

## 2018-05-18 DIAGNOSIS — R05 Cough: Secondary | ICD-10-CM | POA: Diagnosis not present

## 2018-05-23 DIAGNOSIS — I4891 Unspecified atrial fibrillation: Secondary | ICD-10-CM | POA: Diagnosis not present

## 2018-05-23 DIAGNOSIS — I5032 Chronic diastolic (congestive) heart failure: Secondary | ICD-10-CM | POA: Diagnosis not present

## 2018-05-23 DIAGNOSIS — R05 Cough: Secondary | ICD-10-CM | POA: Diagnosis not present

## 2018-05-23 DIAGNOSIS — R0602 Shortness of breath: Secondary | ICD-10-CM | POA: Diagnosis not present

## 2018-05-23 DIAGNOSIS — Z6821 Body mass index (BMI) 21.0-21.9, adult: Secondary | ICD-10-CM | POA: Diagnosis not present

## 2018-05-30 DIAGNOSIS — E1122 Type 2 diabetes mellitus with diabetic chronic kidney disease: Secondary | ICD-10-CM | POA: Diagnosis not present

## 2018-05-30 DIAGNOSIS — E039 Hypothyroidism, unspecified: Secondary | ICD-10-CM | POA: Diagnosis not present

## 2018-05-30 DIAGNOSIS — N183 Chronic kidney disease, stage 3 (moderate): Secondary | ICD-10-CM | POA: Diagnosis not present

## 2018-06-02 DIAGNOSIS — I2510 Atherosclerotic heart disease of native coronary artery without angina pectoris: Secondary | ICD-10-CM | POA: Diagnosis not present

## 2018-06-02 DIAGNOSIS — I5032 Chronic diastolic (congestive) heart failure: Secondary | ICD-10-CM | POA: Diagnosis not present

## 2018-06-02 DIAGNOSIS — Z6821 Body mass index (BMI) 21.0-21.9, adult: Secondary | ICD-10-CM | POA: Diagnosis not present

## 2018-06-02 DIAGNOSIS — E1142 Type 2 diabetes mellitus with diabetic polyneuropathy: Secondary | ICD-10-CM | POA: Diagnosis not present

## 2018-06-02 DIAGNOSIS — I10 Essential (primary) hypertension: Secondary | ICD-10-CM | POA: Diagnosis not present

## 2018-06-02 DIAGNOSIS — E1122 Type 2 diabetes mellitus with diabetic chronic kidney disease: Secondary | ICD-10-CM | POA: Diagnosis not present

## 2018-06-03 DIAGNOSIS — I10 Essential (primary) hypertension: Secondary | ICD-10-CM | POA: Diagnosis not present

## 2018-06-03 DIAGNOSIS — E1140 Type 2 diabetes mellitus with diabetic neuropathy, unspecified: Secondary | ICD-10-CM | POA: Diagnosis not present

## 2018-06-03 DIAGNOSIS — E7800 Pure hypercholesterolemia, unspecified: Secondary | ICD-10-CM | POA: Diagnosis not present

## 2018-06-05 DIAGNOSIS — E1122 Type 2 diabetes mellitus with diabetic chronic kidney disease: Secondary | ICD-10-CM | POA: Diagnosis not present

## 2018-06-05 DIAGNOSIS — N183 Chronic kidney disease, stage 3 (moderate): Secondary | ICD-10-CM | POA: Diagnosis not present

## 2018-06-08 DIAGNOSIS — I5033 Acute on chronic diastolic (congestive) heart failure: Secondary | ICD-10-CM | POA: Diagnosis not present

## 2018-06-08 DIAGNOSIS — I4891 Unspecified atrial fibrillation: Secondary | ICD-10-CM | POA: Diagnosis not present

## 2018-06-08 DIAGNOSIS — I5032 Chronic diastolic (congestive) heart failure: Secondary | ICD-10-CM | POA: Diagnosis not present

## 2018-06-08 DIAGNOSIS — I110 Hypertensive heart disease with heart failure: Secondary | ICD-10-CM | POA: Diagnosis not present

## 2018-06-09 DIAGNOSIS — Z6821 Body mass index (BMI) 21.0-21.9, adult: Secondary | ICD-10-CM | POA: Diagnosis not present

## 2018-06-09 DIAGNOSIS — E1142 Type 2 diabetes mellitus with diabetic polyneuropathy: Secondary | ICD-10-CM | POA: Diagnosis not present

## 2018-06-09 DIAGNOSIS — I5032 Chronic diastolic (congestive) heart failure: Secondary | ICD-10-CM | POA: Diagnosis not present

## 2018-06-09 DIAGNOSIS — E1122 Type 2 diabetes mellitus with diabetic chronic kidney disease: Secondary | ICD-10-CM | POA: Diagnosis not present

## 2018-06-09 DIAGNOSIS — I10 Essential (primary) hypertension: Secondary | ICD-10-CM | POA: Diagnosis not present

## 2018-06-09 DIAGNOSIS — E782 Mixed hyperlipidemia: Secondary | ICD-10-CM | POA: Diagnosis not present

## 2018-06-16 DIAGNOSIS — E538 Deficiency of other specified B group vitamins: Secondary | ICD-10-CM | POA: Diagnosis not present

## 2018-07-13 DIAGNOSIS — E782 Mixed hyperlipidemia: Secondary | ICD-10-CM | POA: Diagnosis not present

## 2018-07-13 DIAGNOSIS — E538 Deficiency of other specified B group vitamins: Secondary | ICD-10-CM | POA: Diagnosis not present

## 2018-07-13 DIAGNOSIS — I10 Essential (primary) hypertension: Secondary | ICD-10-CM | POA: Diagnosis not present

## 2018-07-13 DIAGNOSIS — N183 Chronic kidney disease, stage 3 (moderate): Secondary | ICD-10-CM | POA: Diagnosis not present

## 2018-07-13 DIAGNOSIS — E1122 Type 2 diabetes mellitus with diabetic chronic kidney disease: Secondary | ICD-10-CM | POA: Diagnosis not present

## 2018-07-20 DIAGNOSIS — I10 Essential (primary) hypertension: Secondary | ICD-10-CM | POA: Diagnosis not present

## 2018-07-20 DIAGNOSIS — Z0001 Encounter for general adult medical examination with abnormal findings: Secondary | ICD-10-CM | POA: Diagnosis not present

## 2018-07-20 DIAGNOSIS — I5032 Chronic diastolic (congestive) heart failure: Secondary | ICD-10-CM | POA: Diagnosis not present

## 2018-07-20 DIAGNOSIS — Z6821 Body mass index (BMI) 21.0-21.9, adult: Secondary | ICD-10-CM | POA: Diagnosis not present

## 2018-07-20 DIAGNOSIS — Z23 Encounter for immunization: Secondary | ICD-10-CM | POA: Diagnosis not present

## 2018-07-20 DIAGNOSIS — E1122 Type 2 diabetes mellitus with diabetic chronic kidney disease: Secondary | ICD-10-CM | POA: Diagnosis not present

## 2018-07-20 DIAGNOSIS — E1142 Type 2 diabetes mellitus with diabetic polyneuropathy: Secondary | ICD-10-CM | POA: Diagnosis not present

## 2018-07-20 DIAGNOSIS — Z1212 Encounter for screening for malignant neoplasm of rectum: Secondary | ICD-10-CM | POA: Diagnosis not present

## 2018-07-24 DIAGNOSIS — L570 Actinic keratosis: Secondary | ICD-10-CM | POA: Diagnosis not present

## 2018-09-04 DIAGNOSIS — I10 Essential (primary) hypertension: Secondary | ICD-10-CM | POA: Diagnosis not present

## 2018-09-04 DIAGNOSIS — E1122 Type 2 diabetes mellitus with diabetic chronic kidney disease: Secondary | ICD-10-CM | POA: Diagnosis not present

## 2018-11-13 DIAGNOSIS — I5032 Chronic diastolic (congestive) heart failure: Secondary | ICD-10-CM | POA: Diagnosis not present

## 2018-11-13 DIAGNOSIS — E1165 Type 2 diabetes mellitus with hyperglycemia: Secondary | ICD-10-CM | POA: Diagnosis not present

## 2018-11-13 DIAGNOSIS — E1142 Type 2 diabetes mellitus with diabetic polyneuropathy: Secondary | ICD-10-CM | POA: Diagnosis not present

## 2018-11-13 DIAGNOSIS — I10 Essential (primary) hypertension: Secondary | ICD-10-CM | POA: Diagnosis not present

## 2018-11-13 DIAGNOSIS — E1122 Type 2 diabetes mellitus with diabetic chronic kidney disease: Secondary | ICD-10-CM | POA: Diagnosis not present

## 2018-11-14 DIAGNOSIS — E1122 Type 2 diabetes mellitus with diabetic chronic kidney disease: Secondary | ICD-10-CM | POA: Diagnosis not present

## 2018-11-14 DIAGNOSIS — E782 Mixed hyperlipidemia: Secondary | ICD-10-CM | POA: Diagnosis not present

## 2018-11-14 DIAGNOSIS — E1142 Type 2 diabetes mellitus with diabetic polyneuropathy: Secondary | ICD-10-CM | POA: Diagnosis not present

## 2018-11-14 DIAGNOSIS — I5032 Chronic diastolic (congestive) heart failure: Secondary | ICD-10-CM | POA: Diagnosis not present

## 2018-11-14 DIAGNOSIS — Z6821 Body mass index (BMI) 21.0-21.9, adult: Secondary | ICD-10-CM | POA: Diagnosis not present

## 2018-11-14 DIAGNOSIS — I10 Essential (primary) hypertension: Secondary | ICD-10-CM | POA: Diagnosis not present

## 2018-11-27 DIAGNOSIS — D044 Carcinoma in situ of skin of scalp and neck: Secondary | ICD-10-CM | POA: Diagnosis not present

## 2018-11-27 DIAGNOSIS — C4442 Squamous cell carcinoma of skin of scalp and neck: Secondary | ICD-10-CM | POA: Diagnosis not present

## 2018-11-27 DIAGNOSIS — L570 Actinic keratosis: Secondary | ICD-10-CM | POA: Diagnosis not present

## 2018-12-04 DIAGNOSIS — I5032 Chronic diastolic (congestive) heart failure: Secondary | ICD-10-CM | POA: Diagnosis not present

## 2018-12-04 DIAGNOSIS — I10 Essential (primary) hypertension: Secondary | ICD-10-CM | POA: Diagnosis not present

## 2018-12-04 DIAGNOSIS — E782 Mixed hyperlipidemia: Secondary | ICD-10-CM | POA: Diagnosis not present

## 2018-12-13 DIAGNOSIS — H4389 Other disorders of vitreous body: Secondary | ICD-10-CM | POA: Diagnosis not present

## 2019-01-02 DIAGNOSIS — Z886 Allergy status to analgesic agent status: Secondary | ICD-10-CM | POA: Diagnosis not present

## 2019-01-02 DIAGNOSIS — I4891 Unspecified atrial fibrillation: Secondary | ICD-10-CM | POA: Diagnosis not present

## 2019-01-02 DIAGNOSIS — R0600 Dyspnea, unspecified: Secondary | ICD-10-CM | POA: Diagnosis not present

## 2019-01-02 DIAGNOSIS — Z1159 Encounter for screening for other viral diseases: Secondary | ICD-10-CM | POA: Diagnosis not present

## 2019-01-02 DIAGNOSIS — E119 Type 2 diabetes mellitus without complications: Secondary | ICD-10-CM | POA: Diagnosis not present

## 2019-01-02 DIAGNOSIS — M79622 Pain in left upper arm: Secondary | ICD-10-CM | POA: Diagnosis not present

## 2019-01-02 DIAGNOSIS — R1011 Right upper quadrant pain: Secondary | ICD-10-CM | POA: Diagnosis not present

## 2019-01-02 DIAGNOSIS — Z79899 Other long term (current) drug therapy: Secondary | ICD-10-CM | POA: Diagnosis not present

## 2019-01-02 DIAGNOSIS — I10 Essential (primary) hypertension: Secondary | ICD-10-CM | POA: Diagnosis not present

## 2019-01-02 DIAGNOSIS — R062 Wheezing: Secondary | ICD-10-CM | POA: Diagnosis not present

## 2019-01-02 DIAGNOSIS — Z7984 Long term (current) use of oral hypoglycemic drugs: Secondary | ICD-10-CM | POA: Diagnosis not present

## 2019-01-02 DIAGNOSIS — Z885 Allergy status to narcotic agent status: Secondary | ICD-10-CM | POA: Diagnosis not present

## 2019-01-02 DIAGNOSIS — J40 Bronchitis, not specified as acute or chronic: Secondary | ICD-10-CM | POA: Diagnosis not present

## 2019-01-03 DIAGNOSIS — R05 Cough: Secondary | ICD-10-CM | POA: Diagnosis not present

## 2019-01-04 DIAGNOSIS — E782 Mixed hyperlipidemia: Secondary | ICD-10-CM | POA: Diagnosis not present

## 2019-01-04 DIAGNOSIS — E1122 Type 2 diabetes mellitus with diabetic chronic kidney disease: Secondary | ICD-10-CM | POA: Diagnosis not present

## 2019-02-26 DIAGNOSIS — R55 Syncope and collapse: Secondary | ICD-10-CM | POA: Diagnosis not present

## 2019-02-26 DIAGNOSIS — G479 Sleep disorder, unspecified: Secondary | ICD-10-CM | POA: Diagnosis not present

## 2019-02-26 DIAGNOSIS — R296 Repeated falls: Secondary | ICD-10-CM | POA: Diagnosis not present

## 2019-02-26 DIAGNOSIS — R0602 Shortness of breath: Secondary | ICD-10-CM | POA: Diagnosis not present

## 2019-02-26 DIAGNOSIS — Z6821 Body mass index (BMI) 21.0-21.9, adult: Secondary | ICD-10-CM | POA: Diagnosis not present

## 2019-03-02 DIAGNOSIS — R55 Syncope and collapse: Secondary | ICD-10-CM | POA: Diagnosis not present

## 2019-03-02 DIAGNOSIS — E7849 Other hyperlipidemia: Secondary | ICD-10-CM | POA: Diagnosis not present

## 2019-03-02 DIAGNOSIS — I10 Essential (primary) hypertension: Secondary | ICD-10-CM | POA: Diagnosis not present

## 2019-03-02 DIAGNOSIS — R262 Difficulty in walking, not elsewhere classified: Secondary | ICD-10-CM | POA: Diagnosis not present

## 2019-03-07 DIAGNOSIS — I10 Essential (primary) hypertension: Secondary | ICD-10-CM | POA: Diagnosis not present

## 2019-03-07 DIAGNOSIS — R946 Abnormal results of thyroid function studies: Secondary | ICD-10-CM | POA: Diagnosis not present

## 2019-03-07 DIAGNOSIS — R55 Syncope and collapse: Secondary | ICD-10-CM | POA: Diagnosis not present

## 2019-03-07 DIAGNOSIS — E782 Mixed hyperlipidemia: Secondary | ICD-10-CM | POA: Diagnosis not present

## 2019-03-07 DIAGNOSIS — E1122 Type 2 diabetes mellitus with diabetic chronic kidney disease: Secondary | ICD-10-CM | POA: Diagnosis not present

## 2019-03-07 DIAGNOSIS — E1142 Type 2 diabetes mellitus with diabetic polyneuropathy: Secondary | ICD-10-CM | POA: Diagnosis not present

## 2019-03-07 DIAGNOSIS — E1165 Type 2 diabetes mellitus with hyperglycemia: Secondary | ICD-10-CM | POA: Diagnosis not present

## 2019-03-07 DIAGNOSIS — R262 Difficulty in walking, not elsewhere classified: Secondary | ICD-10-CM | POA: Diagnosis not present

## 2019-03-12 DIAGNOSIS — Z6821 Body mass index (BMI) 21.0-21.9, adult: Secondary | ICD-10-CM | POA: Diagnosis not present

## 2019-03-12 DIAGNOSIS — I10 Essential (primary) hypertension: Secondary | ICD-10-CM | POA: Diagnosis not present

## 2019-03-12 DIAGNOSIS — E1122 Type 2 diabetes mellitus with diabetic chronic kidney disease: Secondary | ICD-10-CM | POA: Diagnosis not present

## 2019-03-12 DIAGNOSIS — E1142 Type 2 diabetes mellitus with diabetic polyneuropathy: Secondary | ICD-10-CM | POA: Diagnosis not present

## 2019-03-12 DIAGNOSIS — Z23 Encounter for immunization: Secondary | ICD-10-CM | POA: Diagnosis not present

## 2019-03-12 DIAGNOSIS — I2510 Atherosclerotic heart disease of native coronary artery without angina pectoris: Secondary | ICD-10-CM | POA: Diagnosis not present

## 2019-03-12 DIAGNOSIS — I5032 Chronic diastolic (congestive) heart failure: Secondary | ICD-10-CM | POA: Diagnosis not present

## 2019-03-27 DIAGNOSIS — L570 Actinic keratosis: Secondary | ICD-10-CM | POA: Diagnosis not present

## 2019-04-04 DIAGNOSIS — I10 Essential (primary) hypertension: Secondary | ICD-10-CM | POA: Diagnosis not present

## 2019-04-04 DIAGNOSIS — E7849 Other hyperlipidemia: Secondary | ICD-10-CM | POA: Diagnosis not present

## 2019-05-04 DIAGNOSIS — E7849 Other hyperlipidemia: Secondary | ICD-10-CM | POA: Diagnosis not present

## 2019-05-04 DIAGNOSIS — I5032 Chronic diastolic (congestive) heart failure: Secondary | ICD-10-CM | POA: Diagnosis not present

## 2019-05-04 DIAGNOSIS — I10 Essential (primary) hypertension: Secondary | ICD-10-CM | POA: Diagnosis not present

## 2019-06-04 DIAGNOSIS — E1122 Type 2 diabetes mellitus with diabetic chronic kidney disease: Secondary | ICD-10-CM | POA: Diagnosis not present

## 2019-06-04 DIAGNOSIS — N184 Chronic kidney disease, stage 4 (severe): Secondary | ICD-10-CM | POA: Diagnosis not present

## 2019-06-04 DIAGNOSIS — I5032 Chronic diastolic (congestive) heart failure: Secondary | ICD-10-CM | POA: Diagnosis not present

## 2019-06-04 DIAGNOSIS — I130 Hypertensive heart and chronic kidney disease with heart failure and stage 1 through stage 4 chronic kidney disease, or unspecified chronic kidney disease: Secondary | ICD-10-CM | POA: Diagnosis not present

## 2019-06-28 DIAGNOSIS — H0100A Unspecified blepharitis right eye, upper and lower eyelids: Secondary | ICD-10-CM | POA: Diagnosis not present

## 2019-07-04 DIAGNOSIS — I5032 Chronic diastolic (congestive) heart failure: Secondary | ICD-10-CM | POA: Diagnosis not present

## 2019-07-04 DIAGNOSIS — N184 Chronic kidney disease, stage 4 (severe): Secondary | ICD-10-CM | POA: Diagnosis not present

## 2019-07-04 DIAGNOSIS — I130 Hypertensive heart and chronic kidney disease with heart failure and stage 1 through stage 4 chronic kidney disease, or unspecified chronic kidney disease: Secondary | ICD-10-CM | POA: Diagnosis not present

## 2019-07-04 DIAGNOSIS — E1122 Type 2 diabetes mellitus with diabetic chronic kidney disease: Secondary | ICD-10-CM | POA: Diagnosis not present

## 2019-07-26 DIAGNOSIS — C4442 Squamous cell carcinoma of skin of scalp and neck: Secondary | ICD-10-CM | POA: Diagnosis not present

## 2019-07-26 DIAGNOSIS — C44329 Squamous cell carcinoma of skin of other parts of face: Secondary | ICD-10-CM | POA: Diagnosis not present

## 2019-07-26 DIAGNOSIS — L570 Actinic keratosis: Secondary | ICD-10-CM | POA: Diagnosis not present

## 2019-08-01 DIAGNOSIS — E1122 Type 2 diabetes mellitus with diabetic chronic kidney disease: Secondary | ICD-10-CM | POA: Diagnosis not present

## 2019-08-01 DIAGNOSIS — E782 Mixed hyperlipidemia: Secondary | ICD-10-CM | POA: Diagnosis not present

## 2019-08-01 DIAGNOSIS — I10 Essential (primary) hypertension: Secondary | ICD-10-CM | POA: Diagnosis not present

## 2019-08-01 DIAGNOSIS — E1142 Type 2 diabetes mellitus with diabetic polyneuropathy: Secondary | ICD-10-CM | POA: Diagnosis not present

## 2019-08-01 DIAGNOSIS — I130 Hypertensive heart and chronic kidney disease with heart failure and stage 1 through stage 4 chronic kidney disease, or unspecified chronic kidney disease: Secondary | ICD-10-CM | POA: Diagnosis not present

## 2019-08-03 DIAGNOSIS — E1122 Type 2 diabetes mellitus with diabetic chronic kidney disease: Secondary | ICD-10-CM | POA: Diagnosis not present

## 2019-08-03 DIAGNOSIS — I130 Hypertensive heart and chronic kidney disease with heart failure and stage 1 through stage 4 chronic kidney disease, or unspecified chronic kidney disease: Secondary | ICD-10-CM | POA: Diagnosis not present

## 2019-08-03 DIAGNOSIS — N184 Chronic kidney disease, stage 4 (severe): Secondary | ICD-10-CM | POA: Diagnosis not present

## 2019-08-03 DIAGNOSIS — I5032 Chronic diastolic (congestive) heart failure: Secondary | ICD-10-CM | POA: Diagnosis not present

## 2019-08-07 DIAGNOSIS — E782 Mixed hyperlipidemia: Secondary | ICD-10-CM | POA: Diagnosis not present

## 2019-08-07 DIAGNOSIS — E1142 Type 2 diabetes mellitus with diabetic polyneuropathy: Secondary | ICD-10-CM | POA: Diagnosis not present

## 2019-08-07 DIAGNOSIS — E1122 Type 2 diabetes mellitus with diabetic chronic kidney disease: Secondary | ICD-10-CM | POA: Diagnosis not present

## 2019-08-07 DIAGNOSIS — I10 Essential (primary) hypertension: Secondary | ICD-10-CM | POA: Diagnosis not present

## 2019-08-07 DIAGNOSIS — Z0001 Encounter for general adult medical examination with abnormal findings: Secondary | ICD-10-CM | POA: Diagnosis not present

## 2019-08-09 DIAGNOSIS — C44329 Squamous cell carcinoma of skin of other parts of face: Secondary | ICD-10-CM | POA: Diagnosis not present

## 2019-08-09 DIAGNOSIS — L988 Other specified disorders of the skin and subcutaneous tissue: Secondary | ICD-10-CM | POA: Diagnosis not present

## 2019-09-04 DIAGNOSIS — I5032 Chronic diastolic (congestive) heart failure: Secondary | ICD-10-CM | POA: Diagnosis not present

## 2019-09-04 DIAGNOSIS — E1122 Type 2 diabetes mellitus with diabetic chronic kidney disease: Secondary | ICD-10-CM | POA: Diagnosis not present

## 2019-09-04 DIAGNOSIS — N184 Chronic kidney disease, stage 4 (severe): Secondary | ICD-10-CM | POA: Diagnosis not present

## 2019-09-04 DIAGNOSIS — I130 Hypertensive heart and chronic kidney disease with heart failure and stage 1 through stage 4 chronic kidney disease, or unspecified chronic kidney disease: Secondary | ICD-10-CM | POA: Diagnosis not present

## 2019-10-04 DIAGNOSIS — N184 Chronic kidney disease, stage 4 (severe): Secondary | ICD-10-CM | POA: Diagnosis not present

## 2019-10-04 DIAGNOSIS — E1122 Type 2 diabetes mellitus with diabetic chronic kidney disease: Secondary | ICD-10-CM | POA: Diagnosis not present

## 2019-10-04 DIAGNOSIS — I130 Hypertensive heart and chronic kidney disease with heart failure and stage 1 through stage 4 chronic kidney disease, or unspecified chronic kidney disease: Secondary | ICD-10-CM | POA: Diagnosis not present

## 2019-10-04 DIAGNOSIS — I5032 Chronic diastolic (congestive) heart failure: Secondary | ICD-10-CM | POA: Diagnosis not present

## 2019-11-03 DIAGNOSIS — I5032 Chronic diastolic (congestive) heart failure: Secondary | ICD-10-CM | POA: Diagnosis not present

## 2019-11-03 DIAGNOSIS — I130 Hypertensive heart and chronic kidney disease with heart failure and stage 1 through stage 4 chronic kidney disease, or unspecified chronic kidney disease: Secondary | ICD-10-CM | POA: Diagnosis not present

## 2019-11-03 DIAGNOSIS — E1122 Type 2 diabetes mellitus with diabetic chronic kidney disease: Secondary | ICD-10-CM | POA: Diagnosis not present

## 2019-11-03 DIAGNOSIS — N184 Chronic kidney disease, stage 4 (severe): Secondary | ICD-10-CM | POA: Diagnosis not present

## 2019-12-05 DEATH — deceased
